# Patient Record
Sex: Female | Born: 2000 | Hispanic: Yes | Marital: Single | Smoking: Never smoker
Health system: Southern US, Community
[De-identification: ages and names within clinical notes are randomized; demographics above are authoritative.]

## PROBLEM LIST (undated history)

## (undated) DIAGNOSIS — D509 Iron deficiency anemia, unspecified: Secondary | ICD-10-CM

---
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
## 2020-01-17 ENCOUNTER — Emergency Department: Payer: Medicaid Other

## 2020-01-17 ENCOUNTER — Other Ambulatory Visit: Payer: Self-pay

## 2020-01-17 ENCOUNTER — Emergency Department
Admission: EM | Admit: 2020-01-17 | Discharge: 2020-01-17 | Disposition: A | Discharge status: Home / Self Care | Payer: Medicaid Other | Attending: Student | Admitting: Student

## 2020-01-17 DIAGNOSIS — B001 Herpesviral vesicular dermatitis: Secondary | ICD-10-CM

## 2020-01-17 DIAGNOSIS — R102 Pelvic and perineal pain: Secondary | ICD-10-CM | POA: Diagnosis not present

## 2020-01-17 DIAGNOSIS — Z202 Contact with and (suspected) exposure to infections with a predominantly sexual mode of transmission: Secondary | ICD-10-CM | POA: Diagnosis not present

## 2020-01-17 DIAGNOSIS — A6004 Herpesviral vulvovaginitis: Secondary | ICD-10-CM | POA: Diagnosis not present

## 2020-01-17 DIAGNOSIS — R350 Frequency of micturition: Secondary | ICD-10-CM | POA: Diagnosis present

## 2020-01-17 LAB — CBC
HCT: 35.2 % — ABNORMAL LOW (ref 36.0–46.0)
Hemoglobin: 11 g/dL — ABNORMAL LOW (ref 12.0–15.0)
MCH: 26.4 pg (ref 26.0–34.0)
MCHC: 31.3 g/dL (ref 30.0–36.0)
MCV: 84.6 fL (ref 80.0–100.0)
Platelets: 209 10*3/uL (ref 150–400)
RBC: 4.16 MIL/uL (ref 3.87–5.11)
RDW: 14.2 % (ref 11.5–15.5)
WBC: 11.9 10*3/uL — ABNORMAL HIGH (ref 4.0–10.5)
nRBC: 0 % (ref 0.0–0.2)

## 2020-01-17 LAB — WET PREP, GENITAL
Clue Cells Wet Prep HPF POC: NONE SEEN
Sperm: NONE SEEN
Trich, Wet Prep: NONE SEEN
Yeast Wet Prep HPF POC: NONE SEEN

## 2020-01-17 LAB — URINALYSIS, COMPLETE (UACMP) WITH MICROSCOPIC
Bilirubin Urine: NEGATIVE
Glucose, UA: NEGATIVE mg/dL
Ketones, ur: 80 mg/dL — AB
Nitrite: NEGATIVE
Protein, ur: 30 mg/dL — AB
Specific Gravity, Urine: 1.028 (ref 1.005–1.030)
pH: 5 (ref 5.0–8.0)

## 2020-01-17 LAB — BASIC METABOLIC PANEL
Anion gap: 9 (ref 5–15)
BUN: 8 mg/dL (ref 6–20)
CO2: 22 mmol/L (ref 22–32)
Calcium: 8.8 mg/dL — ABNORMAL LOW (ref 8.9–10.3)
Chloride: 103 mmol/L (ref 98–111)
Creatinine, Ser: 0.48 mg/dL (ref 0.44–1.00)
GFR calc Af Amer: >60 mL/min (ref >60)
GFR calc non Af Amer: >60 mL/min (ref >60)
Glucose, Bld: 124 mg/dL — ABNORMAL HIGH (ref 70–99)
Potassium: 3.6 mmol/L (ref 3.5–5.1)
Sodium: 134 mmol/L — ABNORMAL LOW (ref 135–145)

## 2020-01-17 LAB — CHLAMYDIA/NGC RT PCR (ARMC ONLY)
Chlamydia Tr: NOT DETECTED
N gonorrhoeae: NOT DETECTED

## 2020-01-17 LAB — POCT PREGNANCY, URINE: Preg Test, Ur: NEGATIVE

## 2020-01-17 MED ORDER — CEFTRIAXONE SODIUM 250 MG IJ SOLR
250.0000 mg | Freq: Once | INTRAMUSCULAR | Status: AC
  Administered 2020-01-17: 250 mg via INTRAMUSCULAR
  Filled 2020-01-17: qty 250

## 2020-01-17 MED ORDER — DEXTROSE 5 % IV SOLN
5.0000 mg/kg | Freq: Once | INTRAVENOUS | Status: AC
  Administered 2020-01-17: 320 mg via INTRAVENOUS
  Filled 2020-01-17: qty 6.4

## 2020-01-17 MED ORDER — ACYCLOVIR 400 MG PO TABS: 400.0000 mg | ORAL_TABLET | Freq: Two times a day (BID) | ORAL | 6 refills | Status: AC

## 2020-01-17 MED ORDER — SODIUM CHLORIDE 0.9 % IV BOLUS
1000.0000 mL | Freq: Once | INTRAVENOUS | Status: AC
  Administered 2020-01-17: 1000 mL via INTRAVENOUS

## 2020-01-17 MED ORDER — AZITHROMYCIN 500 MG PO TABS
1000.0000 mg | ORAL_TABLET | Freq: Once | ORAL | Status: AC
  Administered 2020-01-17: 1000 mg via ORAL
  Filled 2020-01-17: qty 2

## 2020-01-17 MED ORDER — TRAMADOL HCL 50 MG PO TABS: 50.0000 mg | ORAL_TABLET | Freq: Four times a day (QID) | ORAL | 0 refills | Status: AC | PRN

## 2020-01-17 MED ORDER — LIDOCAINE HCL (PF) 1 % IJ SOLN
INTRAMUSCULAR | Status: AC
  Administered 2020-01-17: 0.9 mL
  Filled 2020-01-17: qty 5

## 2020-01-17 NOTE — ED Notes (Signed)
Patient given call light and instructed to call when bladder feels full.

## 2020-01-17 NOTE — ED Provider Notes (Signed)
[facility] Emergency Department Provider Note  ____________________________________________   First MD Initiated Contact with Patient 01/17/20 1209     (approximate)  I have reviewed the triage vital signs and the nursing notes.   HISTORY  Chief Complaint Urinary Frequency    HPI Rachel King is a 19 y.o. female presents emergency department complaining of painful urination for the past 3 days with thick white vaginal discharge.  She states she also has some bumps "down there ".  No nausea or vomiting.  Some abdominal pain.  Patient states that she was tested for Covid and was negative.  Denies any cough or congestion.  No chest pain or shortness of breath    Past Medical History:  Diagnosis Date  . Iron deficiency anemia     There are no problems to display for this patient.   History reviewed. No pertinent surgical history.  Prior to Admission medications   Medication Sig Start Date End Date Taking? Authorizing Provider  acyclovir (ZOVIRAX) 400 MG tablet Take 1 tablet (400 mg total) by mouth 2 (two) times daily. 01/17/20 02/16/20  Adryan Shin, Roselyn Bering, PA-C  traMADol (ULTRAM) 50 MG tablet Take 1 tablet (50 mg total) by mouth every 6 (six) hours as needed. 01/17/20   Faythe Ghee, PA-C    Allergies Penicillins and Shellfish allergy  No family history on file.  Social History Social History   Tobacco Use  . Smoking status: Never Smoker  . Smokeless tobacco: Never Used  Substance Use Topics  . Alcohol use: Not on file  . Drug use: Not on file    Review of Systems  Constitutional:  fever/chills Eyes: No visual changes. ENT: No sore throat. Respiratory: Denies cough Cardiovascular: Denies chest pain Gastrointestinal: Positive abdominal pain Genitourinary: Positive for dysuria. Musculoskeletal: Negative for back pain. Skin: Negative for rash. Psychiatric: no mood changes,      ____________________________________________   PHYSICAL EXAM:  VITAL SIGNS: ED Triage Vitals  Enc Vitals Group     BP 01/17/20 0936 127/70     Pulse Rate 01/17/20 0936 (!) 115     Resp 01/17/20 0936 16     Temp 01/17/20 0936 100.3 F (37.9 C)     Temp Source 01/17/20 0936 Oral     SpO2 --      Weight 01/17/20 0937 140 lb (63.5 kg)     Height 01/17/20 0937 5\' 3"  (1.6 m)     Head Circumference --      Peak Flow --      Pain Score 01/17/20 0937 5     Pain Loc --      Pain Edu? --      Excl. in GC? --     Constitutional: Alert and oriented. Well appearing and in no acute distress. Eyes: Conjunctivae are normal.  Head: Atraumatic. Nose: No congestion/rhinnorhea. Mouth/Throat: Mucous membranes are moist.   Neck:  supple no lymphadenopathy noted Cardiovascular: Normal rate, regular rhythm. Heart sounds are normal Respiratory: Normal respiratory effort.  No retractions, lungs c t a  Abd: soft tender in the left lower quadrant bs normal all 4 quad GU: External vaginal exam shows herpetic-like lesions noted, area is very tender to palpation, patient is not able to tolerate speculum exam bimanual exam does not show any cervical motion tenderness or concerns for PID Musculoskeletal: FROM all extremities, warm and well perfused Neurologic:  Normal speech and language.  Skin:  Skin is warm, dry  Psychiatric: Mood and affect  are normal. Speech and behavior are normal.  ____________________________________________   LABS (all labs ordered are listed, but only abnormal results are displayed)  Labs Reviewed  WET PREP, GENITAL - Abnormal; Notable for the following components:      Result Value   WBC, Wet Prep HPF POC FEW (*)    All other components within normal limits  URINALYSIS, COMPLETE (UACMP) WITH MICROSCOPIC - Abnormal; Notable for the following components:   Color, Urine YELLOW (*)    APPearance HAZY (*)    Hgb urine dipstick SMALL (*)    Ketones, ur 80 (*)     Protein, ur 30 (*)    Leukocytes,Ua MODERATE (*)    Bacteria, UA RARE (*)    All other components within normal limits  BASIC METABOLIC PANEL - Abnormal; Notable for the following components:   Sodium 134 (*)    Glucose, Bld 124 (*)    Calcium 8.8 (*)    All other components within normal limits  CBC - Abnormal; Notable for the following components:   WBC 11.9 (*)    Hemoglobin 11.0 (*)    HCT 35.2 (*)    All other components within normal limits  CHLAMYDIA/NGC RT PCR ([facility] ONLY)  URINE CULTURE  HSV 2 ANTIBODY, IGG  POC URINE PREG, ED  POCT PREGNANCY, URINE   ____________________________________________   ____________________________________________  RADIOLOGY  Ultrasound the pelvis does not show a torsion and is otherwise normal  ____________________________________________   PROCEDURES  Procedure(s) performed: Acyclovir IV , Rocephin 250 mg IM, Zithromax 1 g p.o.  Procedures    ____________________________________________   INITIAL IMPRESSION / ASSESSMENT AND PLAN / ED COURSE  Pertinent labs & imaging results that were available during my care of the patient were reviewed by me and considered in my medical decision making (see chart for details).   Patient is 19 year old female presents emergency department with abdominal pain, painful urination, and vaginal discharge for 3 days.  Positive for fever.  Was tested for Covid this week and was negative.  See HPI  Physical exam patient appears stable.  She does have low-grade temp of 100.3 with elevated pulse at 115.  Remainder the vitals are normal.  Physical exam does show a small amount of tenderness in the left lower quadrant.  DDx: PID, UTI, ovarian cyst, herpes, abscess  CBC is elevated WBC of 11.9, basic metabolic panel is generally normal with sodium at 134, glucose of 124, calcium 8.8, POC pregnancy is negative, urinalysis does show 80 ketones, moderate leuks, 21-50 WBCs and rare  bacteria.  Pelvic exam did show herpetic lesions in the exterior with a thickened white and somewhat discolored discharge.  Due to the herpetic type lesions I did do a HSV IgG test. Patient was given acyclovir IV, Zithromax 1 g p.o., Rocephin 250 mg IM.  She was given prescriptions for acyclovir and tramadol.  She is to follow-up with [facility] department if her HSV testing shows is positive as this would indicate testing for HIV and syphilis.  She states she understands will comply.  She was discharged in stable condition.      Rachel King was evaluated in Emergency Department on 01/17/2020 for the symptoms described in the history of present illness. She was evaluated in the context of the global COVID-19 pandemic, which necessitated consideration that the patient might be at risk for infection with the SARS-CoV-2 virus that causes COVID-19. Institutional protocols and algorithms that pertain to the evaluation of patients at  risk for COVID-19 are in a state of rapid change based on information released by regulatory bodies including the CDC and federal and state organizations. These policies and algorithms were followed during the patient's care in the ED.   As part of my medical decision making, I reviewed the following data within the [place] notes reviewed and incorporated, Labs reviewed , Old chart reviewed, Radiograph reviewed , Notes from prior ED visits and [place] Controlled Substance Database  ____________________________________________   FINAL CLINICAL IMPRESSION(S) / ED DIAGNOSES  Final diagnoses:  Pelvic pain  Herpes labialis without complication  STD exposure      NEW MEDICATIONS STARTED DURING THIS VISIT:  New Prescriptions   ACYCLOVIR (ZOVIRAX) 400 MG TABLET    Take 1 tablet (400 mg total) by mouth 2 (two) times daily.   TRAMADOL (ULTRAM) 50 MG TABLET    Take 1 tablet (50 mg total) by mouth every 6 (six) hours as needed.      Note:  This document was prepared using Dragon voice recognition software and may include unintentional dictation errors.    Versie Starks, PA-C 01/17/20 1555    Lilia Pro., MD 01/17/20 [phone]

## 2020-01-17 NOTE — ED Triage Notes (Signed)
Pt c/o painful urination for the past 3 days with thick white vaginal discharge. States she had a fever this morning and took tylenol at 4am.

## 2020-01-17 NOTE — Discharge Instructions (Signed)
Follow-up with the health department if your herpes test is positive. Return to the emergency department if worsening Take your medication as prescribed

## 2020-01-18 LAB — URINE CULTURE: Culture: NO GROWTH

## 2020-01-18 LAB — HSV 2 ANTIBODY, IGG: HSV 2 Glycoprotein G Ab, IgG: <0.91 index (ref 0.00–0.90)

## 2020-02-12 ENCOUNTER — Encounter: Payer: Self-pay | Admitting: Certified Nurse Midwife

## 2020-02-12 ENCOUNTER — Ambulatory Visit (INDEPENDENT_AMBULATORY_CARE_PROVIDER_SITE_OTHER): Payer: Self-pay | Admitting: Certified Nurse Midwife

## 2020-02-12 ENCOUNTER — Other Ambulatory Visit: Payer: Self-pay

## 2020-02-12 ENCOUNTER — Other Ambulatory Visit (HOSPITAL_COMMUNITY)
Admission: RE | Admit: 2020-02-12 | Discharge: 2020-02-12 | Disposition: A | Discharge status: Home / Self Care | Payer: Medicaid Other | Source: Ambulatory Visit | Attending: Certified Nurse Midwife | Admitting: Certified Nurse Midwife

## 2020-02-12 VITALS — BP 108/86 | HR 92 | Ht 63.0 in | Wt 138.3 lb

## 2020-02-12 DIAGNOSIS — Z113 Encounter for screening for infections with a predominantly sexual mode of transmission: Secondary | ICD-10-CM | POA: Diagnosis present

## 2020-02-12 DIAGNOSIS — Z23 Encounter for immunization: Secondary | ICD-10-CM

## 2020-02-12 MED ORDER — TETANUS-DIPHTH-ACELL PERTUSSIS 5-2.5-18.5 LF-MCG/0.5 IM SUSP: 0.5000 mL | Freq: Once | INTRAMUSCULAR | Status: DC

## 2020-02-12 MED ORDER — TETANUS-DIPHTH-ACELL PERTUSSIS 5-2.5-18.5 LF-MCG/0.5 IM SUSP
0.5000 mL | Freq: Once | INTRAMUSCULAR | Status: AC
  Administered 2020-02-12: 0.5 mL via INTRAMUSCULAR

## 2020-02-12 NOTE — Patient Instructions (Signed)
Vaginitis Vaginitis is a condition in which the vaginal tissue swells and becomes red (inflamed). This condition is most often caused by a change in the normal balance of bacteria and yeast that live in the vagina. This change causes an overgrowth of certain bacteria or yeast, which causes the inflammation. There are different types of vaginitis, but the most common types are:  Bacterial vaginosis.  Yeast infection (candidiasis).  Trichomoniasis vaginitis. This is a sexually transmitted disease (STD).  Viral vaginitis.  Atrophic vaginitis.  Allergic vaginitis. What are the causes? The cause of this condition depends on the type of vaginitis. It can be caused by:  Bacteria (bacterial vaginosis).  Yeast, which is a fungus (yeast infection).  A parasite (trichomoniasis vaginitis).  A virus (viral vaginitis).  Low hormone levels (atrophic vaginitis). Low hormone levels can occur during pregnancy, breastfeeding, or after menopause.  Irritants, such as bubble baths, scented tampons, and feminine sprays (allergic vaginitis). Other factors can change the normal balance of the yeast and bacteria that live in the vagina. These include:  Antibiotic medicines.  Poor hygiene.  Diaphragms, vaginal sponges, spermicides, birth control pills, and intrauterine devices (IUD).  Sex.  Infection.  Uncontrolled diabetes.  A weakened defense (immune) system. What increases the risk? This condition is more likely to develop in women who:  Smoke.  Use vaginal douches, scented tampons, or scented sanitary pads.  Wear tight-fitting pants.  Wear thong underwear.  Use oral birth control pills or an IUD.  Have sex without a condom.  Have multiple sex partners.  Have an STD.  Frequently use the spermicide nonoxynol-9.  Eat lots of foods high in sugar.  Have uncontrolled diabetes.  Have low estrogen levels.  Have a weakened immune system from an immune disorder or medical  treatment.  Are pregnant or breastfeeding. What are the signs or symptoms? Symptoms vary depending on the cause of the vaginitis. Common symptoms include:  Abnormal vaginal discharge. ? The discharge is white, gray, or yellow with bacterial vaginosis. ? The discharge is thick, white, and cheesy with a yeast infection. ? The discharge is frothy and yellow or greenish with trichomoniasis.  A bad vaginal smell. The smell is fishy with bacterial vaginosis.  Vaginal itching, pain, or swelling.  Sex that is painful.  Pain or burning when urinating. Sometimes there are no symptoms. How is this diagnosed? This condition is diagnosed based on your symptoms and medical history. A physical exam, including a pelvic exam, will also be done. You may also have other tests, including:  Tests to determine the pH level (acidity or alkalinity) of your vagina.  A whiff test, to assess the odor that results when a sample of your vaginal discharge is mixed with a potassium hydroxide solution.  Tests of vaginal fluid. A sample will be examined under a microscope. How is this treated? Treatment varies depending on the type of vaginitis you have. Your treatment may include:  Antibiotic creams or pills to treat bacterial vaginosis and trichomoniasis.  Antifungal medicines, such as vaginal creams or suppositories, to treat a yeast infection.  Medicine to ease discomfort if you have viral vaginitis. Your sexual partner should also be treated.  Estrogen delivered in a cream, pill, suppository, or vaginal ring to treat atrophic vaginitis. If vaginal dryness occurs, lubricants and moisturizing creams may help. You may need to avoid scented soaps, sprays, or douches.  Stopping use of a product that is causing allergic vaginitis. Then using a vaginal cream to treat the symptoms. Follow   these instructions at home: Lifestyle  Keep your genital area clean and dry. Avoid soap, and only rinse the area with  water.  Do not douche or use tampons until your health care provider says it is okay to do so. Use sanitary pads, if needed.  Do not have sex until your health care provider approves. When you can return to sex, practice safe sex and use condoms.  Wipe from front to back. This avoids the spread of bacteria from the rectum to the vagina. General instructions  Take over-the-counter and prescription medicines only as told by your health care provider.  If you were prescribed an antibiotic medicine, take or use it as told by your health care provider. Do not stop taking or using the antibiotic even if you start to feel better.  Keep all follow-up visits as told by your health care provider. This is important. How is this prevented?  Use mild, non-scented products. Do not use things that can irritate the vagina, such as fabric softeners. Avoid the following products if they are scented: ? Feminine sprays. ? Detergents. ? Tampons. ? Feminine hygiene products. ? Soaps or bubble baths.  Let air reach your genital area. ? Wear cotton underwear to reduce moisture buildup. ? Avoid wearing underwear while you sleep. ? Avoid wearing tight pants and underwear or nylons without a cotton panel. ? Avoid wearing thong underwear.  Take off any wet clothing, such as bathing suits, as soon as possible.  Practice safe sex and use condoms. Contact a health care provider if:  You have abdominal pain.  You have a fever.  You have symptoms that last for more than 2-3 days. Get help right away if:  You have a fever and your symptoms suddenly get worse. Summary  Vaginitis is a condition in which the vaginal tissue becomes inflamed.This condition is most often caused by a change in the normal balance of bacteria and yeast that live in the vagina.  Treatment varies depending on the type of vaginitis you have.  Do not douche, use tampons , or have sex until your health care provider approves. When  you can return to sex, practice safe sex and use condoms. This information is not intended to replace advice given to you by your health care provider. Make sure you discuss any questions you have with your health care provider. Document Revised: 09/22/2017 Document Reviewed: 11/15/2016 Elsevier Patient Education  2020 [employer]  

## 2020-02-12 NOTE — Progress Notes (Signed)
GYN ENCOUNTER NOTE  Subjective:       Rachel King is a 19 y.o. No obstetric history on file. female is here for gynecologic evaluation of the following issues:  1. Pt states she was referred to [place] after being in the hospital for pelvic infection. She is unsure what the infections was. She states she had some pimples on her bottom that were painful with wiping. They gave her acyclovir. They told her that her test for chlamydia and herpes was negative. She is here today for follow up testing. She is sexually active with one female partner. She has had 7 total partners in her life.    Gynecologic History Patient's last menstrual period was 01/29/2020 (exact date). Contraception: condoms, Some times Last Pap: n/a  Last mammogram: n/a   Obstetric History OB History  Gravida Para Term Preterm AB Living  0 0 0 0 0 0  SAB TAB Ectopic Multiple Live Births  0 0 0 0 0    History reviewed. No pertinent past medical history.  History reviewed. No pertinent surgical history.  Current Outpatient Medications on File Prior to Visit  Medication Sig Dispense Refill  . acyclovir (ZOVIRAX) 400 MG tablet Take 400 mg by mouth 2 (two) times daily.    . ferrous sulfate 325 (65 FE) MG EC tablet Take 325 mg by mouth 3 (three) times daily with meals.     No current facility-administered medications on file prior to visit.    Allergies  Allergen Reactions  . Penicillins     Social History   Socioeconomic History  . Marital status: Single    Spouse name: Not on file  . Number of children: Not on file  . Years of education: Not on file  . Highest education level: Not on file  Occupational History  . Not on file  Tobacco Use  . Smoking status: Never Smoker  . Smokeless tobacco: Never Used  Substance and Sexual Activity  . Alcohol use: Never  . Drug use: Never  . Sexual activity: Yes    Birth control/protection: None  Other Topics Concern  . Not on file  Social History Narrative  . Not on  file   Social Determinants of Health   Financial Resource Strain:   . Difficulty of Paying Living Expenses:   Food Insecurity:   . Worried About Programme researcher, broadcasting/film/video in the Last Year:   . Barista in the Last Year:   Transportation Needs:   . Freight forwarder (Medical):   Marland Kitchen Lack of Transportation (Non-Medical):   Physical Activity:   . Days of Exercise per Week:   . Minutes of Exercise per Session:   Stress:   . Feeling of Stress :   Social Connections:   . Frequency of Communication with Friends and Family:   . Frequency of Social Gatherings with Friends and Family:   . Attends Religious Services:   . Active Member of Clubs or Organizations:   . Attends Banker Meetings:   Marland Kitchen Marital Status:   Intimate Partner Violence:   . Fear of Current or Ex-Partner:   . Emotionally Abused:   Marland Kitchen Physically Abused:   . Sexually Abused:     History reviewed. No pertinent family history.  The following portions of the patient's history were reviewed and updated as appropriate: allergies, current medications, past family history, past medical history, past social history, past surgical history and problem list.  Review of Systems Review of  Systems - Negative except HPI Review of Systems - General ROS: negative for - chills, fatigue, fever, hot flashes, malaise or night sweats Hematological and Lymphatic ROS: negative for - bleeding problems or swollen lymph nodes Gastrointestinal ROS: negative for - abdominal pain, blood in stools, change in bowel habits and nausea/vomiting Musculoskeletal ROS: negative for - joint pain, muscle pain or muscular weakness Genito-Urinary ROS: negative for - change in menstrual cycle, dysmenorrhea, dyspareunia, dysuria, genital discharge, genital ulcers, hematuria, incontinence, irregular/heavy menses, nocturia or pelvic pain Objective:   BP 108/86   Pulse 92   Ht 5\' 3"  (1.6 m)   Wt 138 lb 5 oz (62.7 kg)   LMP 01/29/2020 (Exact  Date)   BMI 24.50 kg/m  CONSTITUTIONAL: Well-developed, well-nourished female in no acute distress.  HENT:  Normocephalic, atraumatic.  NECK: Normal range of motion, supple, no masses.  Normal thyroid.  SKIN: Skin is warm and dry. No rash noted. Not diaphoretic. No erythema. No pallor. [place]: Alert and oriented to person, place, and time. PSYCHIATRIC: Normal mood and affect. Normal behavior. Normal judgment and thought content. CARDIOVASCULAR:Not Examined RESPIRATORY: Not Examined BREASTS: Not Examined ABDOMEN: Soft, non distended; Non tender.  No Organomegaly. PELVIC:  External Genitalia: Normal, no active lesions seen  BUS: Normal  Vagina: Normal  Cervix: Normal  Bladder: Nontender MUSCULOSKELETAL: Normal range of motion. No tenderness.  No cyanosis, clubbing, or edema.   Assessment:   Screening for STD   Plan:   Discussed STDs and encouraged condom use all the time. Specifically talked about HSV 1 HSV2 , outbreaks and treatment. Labs collected today. Answered all of her questions.Pt declines other methods of birth control.  Follow up for annual exam or PRN.   Philip Aspen, CNM

## 2020-02-13 LAB — HEPATITIS B SURFACE ANTIGEN: Hepatitis B Surface Ag: NEGATIVE

## 2020-02-13 LAB — HSV 1 AND 2 IGM ABS, INDIRECT
HSV 1 IgM: <1:10 {titer}
HSV 2 IgM: <1:10 {titer}

## 2020-02-13 LAB — HIV ANTIBODY (ROUTINE TESTING W REFLEX): HIV Screen 4th Generation wRfx: NONREACTIVE

## 2020-02-13 LAB — SYPHILIS: RPR W/REFLEX TO RPR TITER AND TREPONEMAL ANTIBODIES, TRADITIONAL SCREENING AND DIAGNOSIS ALGORITHM: RPR Ser Ql: NONREACTIVE

## 2020-02-14 LAB — CERVICOVAGINAL ANCILLARY ONLY
Bacterial Vaginitis (gardnerella): POSITIVE — AB
Candida Glabrata: NEGATIVE
Candida Vaginitis: POSITIVE — AB
Chlamydia: NEGATIVE
Comment: NEGATIVE
Comment: NEGATIVE
Comment: NEGATIVE
Comment: NEGATIVE
Comment: NEGATIVE
Comment: NORMAL
Neisseria Gonorrhea: NEGATIVE
Trichomonas: NEGATIVE

## 2020-02-17 ENCOUNTER — Telehealth: Payer: Self-pay | Admitting: Certified Nurse Midwife

## 2020-02-17 ENCOUNTER — Telehealth: Payer: Self-pay

## 2020-02-17 ENCOUNTER — Other Ambulatory Visit: Payer: Self-pay | Admitting: Certified Nurse Midwife

## 2020-02-17 MED ORDER — METRONIDAZOLE 500 MG PO TABS: 500.0000 mg | ORAL_TABLET | Freq: Two times a day (BID) | ORAL | 0 refills | Status: AC

## 2020-02-17 MED ORDER — FLUCONAZOLE 150 MG PO TABS: 150.0000 mg | ORAL_TABLET | Freq: Once | ORAL | 0 refills | Status: AC

## 2020-02-17 NOTE — Progress Notes (Signed)
Pt vaginal swab positive for yeast and BV, orders placed for treatment. Pt notified via my chart.   Doreene Burke, CNM

## 2020-02-17 NOTE — Telephone Encounter (Signed)
Returned patients call- all questions answered regarding test results.

## 2020-02-17 NOTE — Telephone Encounter (Signed)
Patient called requesting someone contact her regarding her lab results. Please advise.

## 2020-03-16 ENCOUNTER — Encounter: Payer: Self-pay | Admitting: Certified Nurse Midwife

## 2020-03-16 ENCOUNTER — Other Ambulatory Visit: Payer: Self-pay

## 2020-03-16 ENCOUNTER — Ambulatory Visit (INDEPENDENT_AMBULATORY_CARE_PROVIDER_SITE_OTHER): Payer: Medicaid Other | Admitting: Certified Nurse Midwife

## 2020-03-16 VITALS — BP 120/65 | HR 99 | Ht 63.0 in | Wt 142.2 lb

## 2020-03-16 DIAGNOSIS — N926 Irregular menstruation, unspecified: Secondary | ICD-10-CM | POA: Diagnosis not present

## 2020-03-16 LAB — POCT URINE PREGNANCY: Preg Test, Ur: POSITIVE — AB

## 2020-03-16 NOTE — Progress Notes (Signed)
Subjective:    Rachel King is a 19 y.o. female who presents for evaluation of amenorrhea. She believes she could be pregnant. Pregnancy is desired. Sexual Activity: single partner, contraception: none. Current symptoms also include: nausea. Last period was normal.   Patient's last menstrual period was 01/29/2020 (exact date). The following portions of the patient's history were reviewed and updated as appropriate: allergies, current medications, past family history, past medical history, past social history, past surgical history and problem list.  Review of Systems Pertinent items are noted in HPI.     Objective:    BP 120/65   Pulse 99   Ht 5\' 3"  (1.6 m)   Wt 142 lb 4 oz (64.5 kg)   LMP 01/29/2020 (Exact Date)   BMI 25.20 kg/m  General: alert, cooperative, appears stated age and no acute distress    Lab Review Urine HCG: positive    Assessment:    Absence of menstruation.     Plan:   Positive: EDC: 11/04/20. Briefly discussed pre-natal care options. Md or Midwifery care discussed.  Encouraged well-balanced diet, plenty of rest when needed, pre-natal vitamins daily and walking for exercise. Discussed self-help for nausea, avoiding OTC medications until consulting provider or pharmacist, other than Tylenol as needed, minimal caffeine (1-2 cups daily) and avoiding alcohol. She will schedule an u/s 2 wks for dating, nurse visit in 4 wks and  her initial NOB visit in 6 wks.. Feel free to call with any questions  01/02/21, CNM

## 2020-03-31 ENCOUNTER — Ambulatory Visit (INDEPENDENT_AMBULATORY_CARE_PROVIDER_SITE_OTHER): Payer: Medicaid Other

## 2020-03-31 ENCOUNTER — Other Ambulatory Visit: Payer: Self-pay

## 2020-03-31 DIAGNOSIS — N926 Irregular menstruation, unspecified: Secondary | ICD-10-CM | POA: Diagnosis not present

## 2020-03-31 DIAGNOSIS — Z3A09 9 weeks gestation of pregnancy: Secondary | ICD-10-CM | POA: Diagnosis not present

## 2020-04-10 ENCOUNTER — Other Ambulatory Visit: Payer: Self-pay

## 2020-04-10 ENCOUNTER — Ambulatory Visit (INDEPENDENT_AMBULATORY_CARE_PROVIDER_SITE_OTHER): Payer: Medicaid Other | Admitting: Surgical

## 2020-04-10 VITALS — BP 122/74 | HR 93 | Ht 63.0 in | Wt 145.8 lb

## 2020-04-10 DIAGNOSIS — Z3491 Encounter for supervision of normal pregnancy, unspecified, first trimester: Secondary | ICD-10-CM

## 2020-04-10 NOTE — Progress Notes (Signed)
Rachel King presents for [employer] nurse interview visit. Pregnancy confirmation done 03/16/2020. G1. P. Pregnancy education material explained and given. 0 cats in home. NOB labs ordered. HIV labs and drug screen were explained and ordered. PNV encouraged. Genetic screening options discussed. Genetic testing: Ordered/Declined/Unsure. Patient may discuss with the provider. Patient to follow up with provider on 04/28/2020 weeks for NOB physical. All questions answered.   Patient discussed that she may transfer care to [facility]. She is wanting to deliver at [facility].

## 2020-04-11 LAB — MICROSCOPIC EXAMINATION
Casts: NONE SEEN /lpf
RBC, Urine: NONE SEEN /hpf (ref 0–2)

## 2020-04-11 LAB — ABO AND RH: Rh Factor: POSITIVE

## 2020-04-11 LAB — ANTIBODY SCREEN: Antibody Screen: NEGATIVE

## 2020-04-11 LAB — URINALYSIS, ROUTINE W REFLEX MICROSCOPIC
Bilirubin, UA: NEGATIVE
Ketones, UA: NEGATIVE
Nitrite, UA: NEGATIVE
Protein,UA: NEGATIVE
RBC, UA: NEGATIVE
Specific Gravity, UA: 1.018 (ref 1.005–1.030)
Urobilinogen, Ur: 0.2 mg/dL (ref 0.2–1.0)
pH, UA: 7 (ref 5.0–7.5)

## 2020-04-11 LAB — RUBELLA SCREEN: Rubella Antibodies, IGG: 3.84 index (ref >0.99)

## 2020-04-11 LAB — RPR: RPR Ser Ql: NONREACTIVE

## 2020-04-11 LAB — HEPATITIS B SURFACE ANTIGEN: Hepatitis B Surface Ag: NEGATIVE

## 2020-04-11 LAB — VARICELLA ZOSTER ANTIBODY, IGG: Varicella zoster IgG: 1260 index (ref >165)

## 2020-04-11 LAB — HIV ANTIBODY (ROUTINE TESTING W REFLEX): HIV Screen 4th Generation wRfx: NONREACTIVE

## 2020-04-12 LAB — GC/CHLAMYDIA PROBE AMP
Chlamydia trachomatis, NAA: NEGATIVE
Neisseria Gonorrhoeae by PCR: NEGATIVE

## 2020-04-13 LAB — MONITOR DRUG PROFILE 14(MW)
Amphetamine Scrn, Ur: NEGATIVE ng/mL
BARBITURATE SCREEN URINE: NEGATIVE ng/mL
BENZODIAZEPINE SCREEN, URINE: NEGATIVE ng/mL
Buprenorphine, Urine: NEGATIVE ng/mL
CANNABINOIDS UR QL SCN: NEGATIVE ng/mL
Cocaine (Metab) Scrn, Ur: NEGATIVE ng/mL
Creatinine(Crt), U: 51.5 mg/dL (ref 20.0–300.0)
Fentanyl, Urine: NEGATIVE pg/mL
Meperidine Screen, Urine: NEGATIVE ng/mL
Methadone Screen, Urine: NEGATIVE ng/mL
OXYCODONE+OXYMORPHONE UR QL SCN: NEGATIVE ng/mL
Opiate Scrn, Ur: NEGATIVE ng/mL
Ph of Urine: 6.7 (ref 4.5–8.9)
Phencyclidine Qn, Ur: NEGATIVE ng/mL
Propoxyphene Scrn, Ur: NEGATIVE ng/mL
SPECIFIC GRAVITY: 1.009
Tramadol Screen, Urine: NEGATIVE ng/mL

## 2020-04-13 LAB — NICOTINE SCREEN, URINE: Cotinine Ql Scrn, Ur: NEGATIVE ng/mL

## 2020-04-13 LAB — URINE CULTURE, OB REFLEX

## 2020-04-13 LAB — CULTURE, OB URINE

## 2020-04-14 ENCOUNTER — Other Ambulatory Visit: Payer: Self-pay | Admitting: Certified Nurse Midwife

## 2020-04-14 MED ORDER — NITROFURANTOIN MONOHYD MACRO 100 MG PO CAPS
100.0000 mg | ORAL_CAPSULE | Freq: Two times a day (BID) | ORAL | 0 refills | Status: AC
Start: 2020-04-14 — End: 2020-04-21

## 2020-04-28 ENCOUNTER — Encounter: Payer: Medicaid Other | Admitting: Certified Nurse Midwife

## 2021-02-15 ENCOUNTER — Encounter: Payer: Self-pay | Admitting: Certified Nurse Midwife

## 2021-03-01 ENCOUNTER — Encounter: Payer: Self-pay | Admitting: Certified Nurse Midwife

## 2021-09-24 IMAGING — US US PELVIS COMPLETE
1 series · 14 of 25 positions shown · non-contrast
Comparison: None.

CLINICAL DATA: Left lower quadrant abdomen pain

EXAM:
TRANSABDOMINAL ULTRASOUND OF PELVIS
DOPPLER ULTRASOUND OF OVARIES
TECHNIQUE: Transabdominal ultrasound examination of the pelvis was performed
including evaluation of the uterus, ovaries, adnexal regions, and
pelvic cul-de-sac.
Color and duplex Doppler ultrasound was utilized to evaluate blood
flow to the ovaries.

[Series 1: us pelvis (transabdominal only) · 14 of 54 slices shown]
[im 1/54]
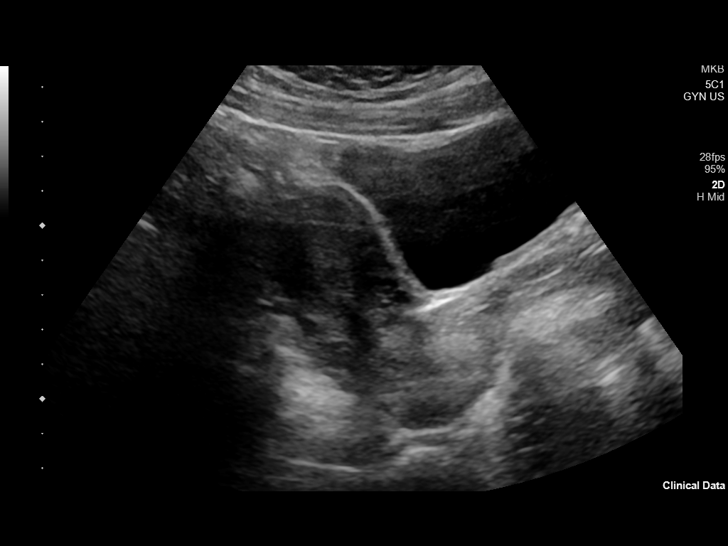
[im 5/54]
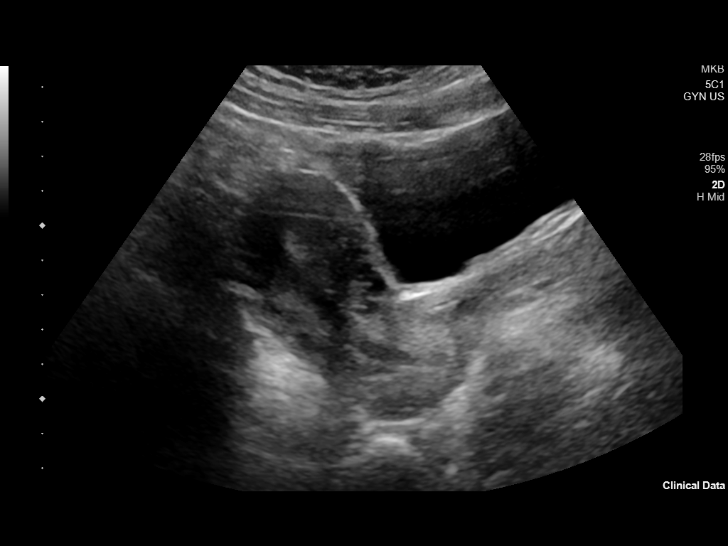
[im 9/54]
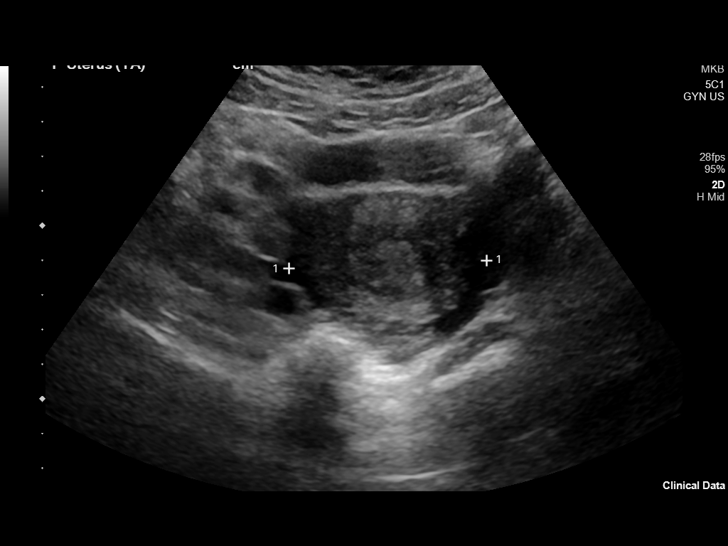
[im 14/54]
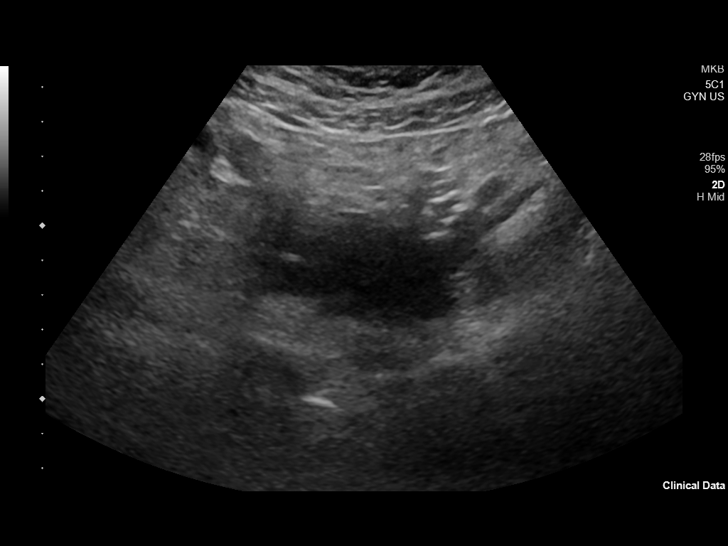
[im 18/54]
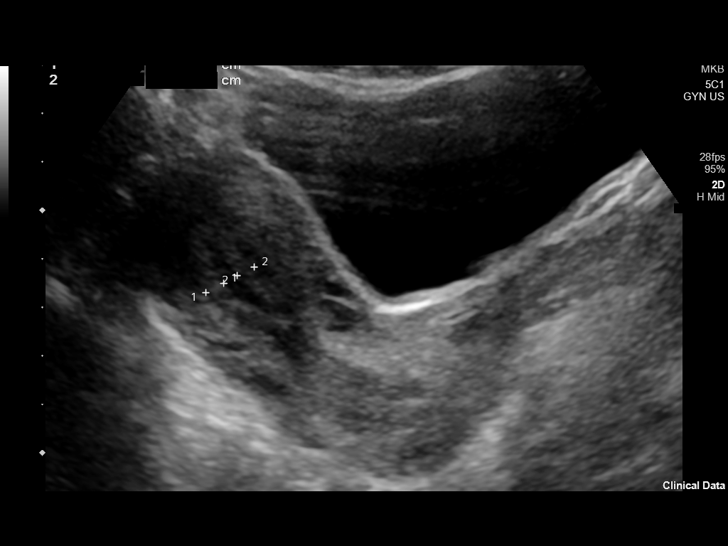
[im 20/54]
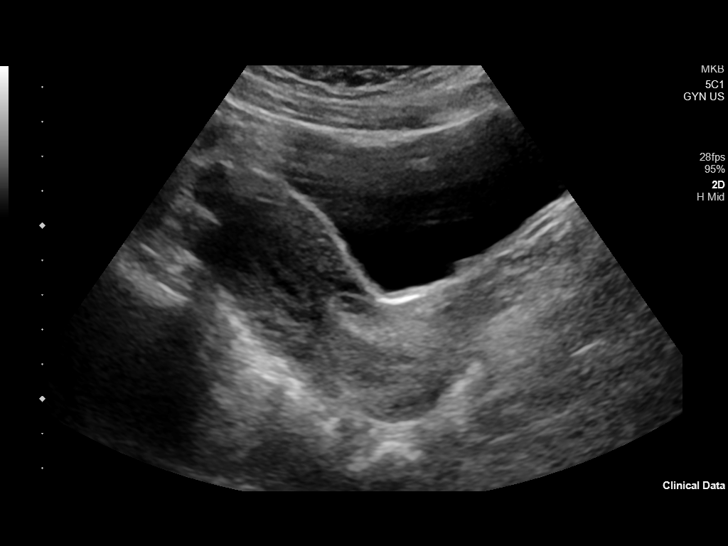
[im 25/54]
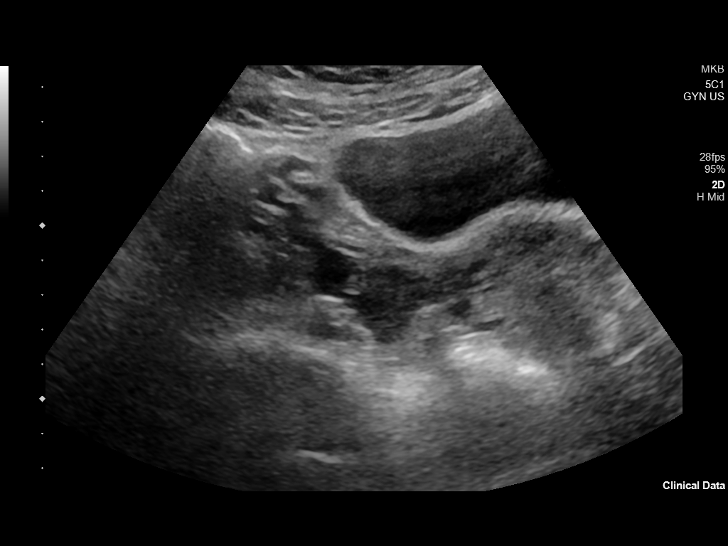
[im 29/54]
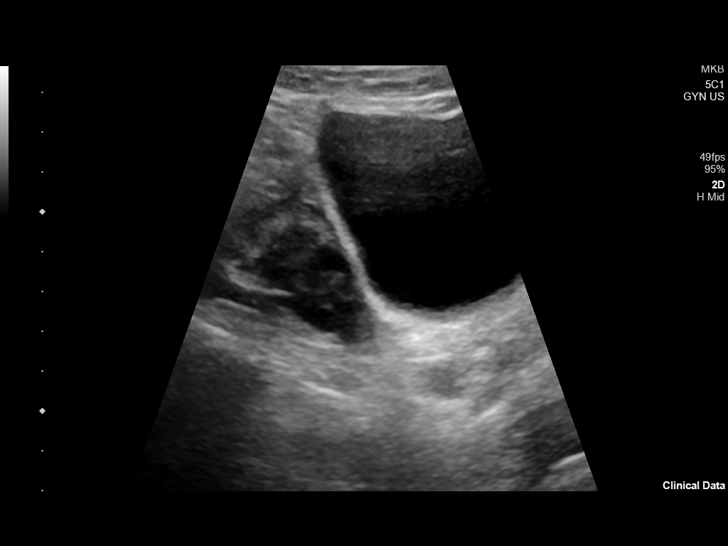
[im 34/54]
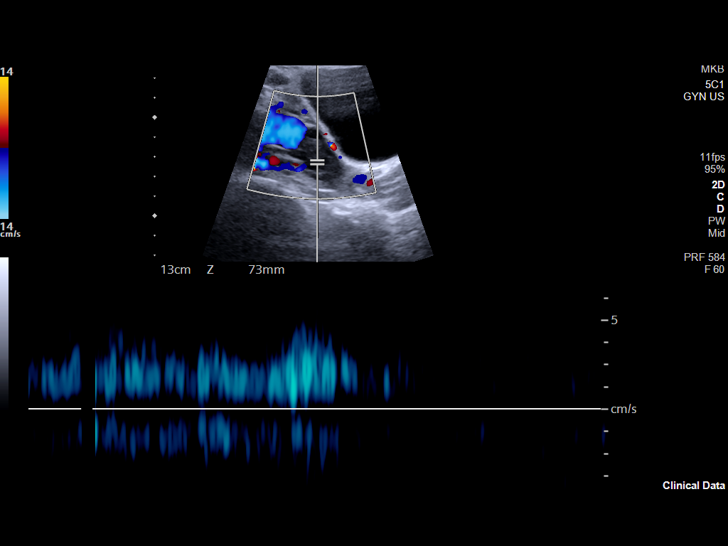
[im 36/54]
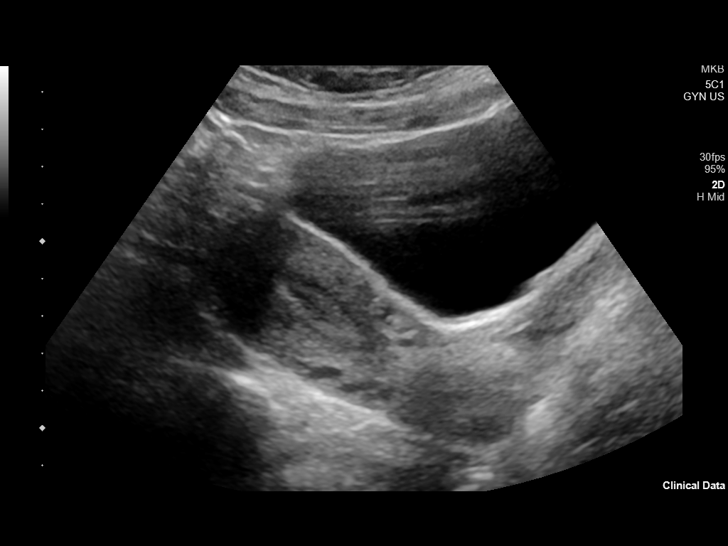
[im 40/54]
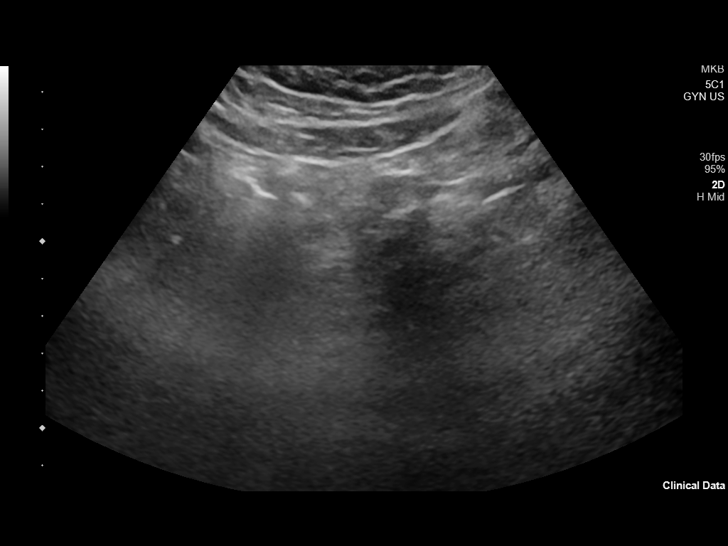
[im 45/54]
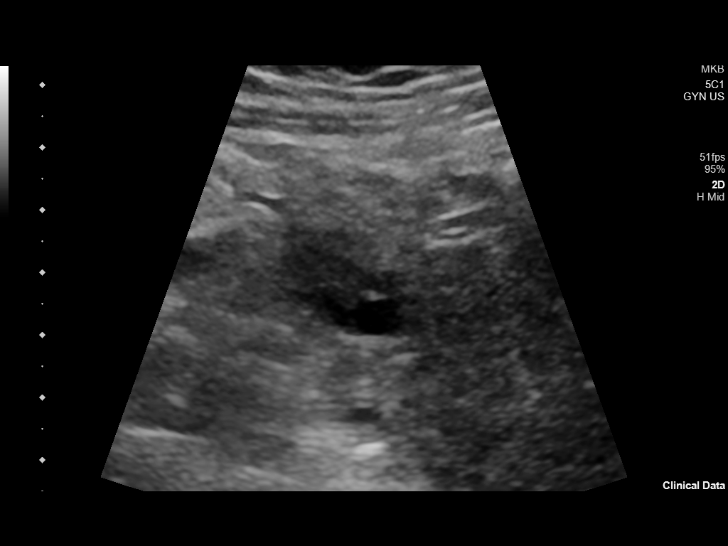
[im 49/54]
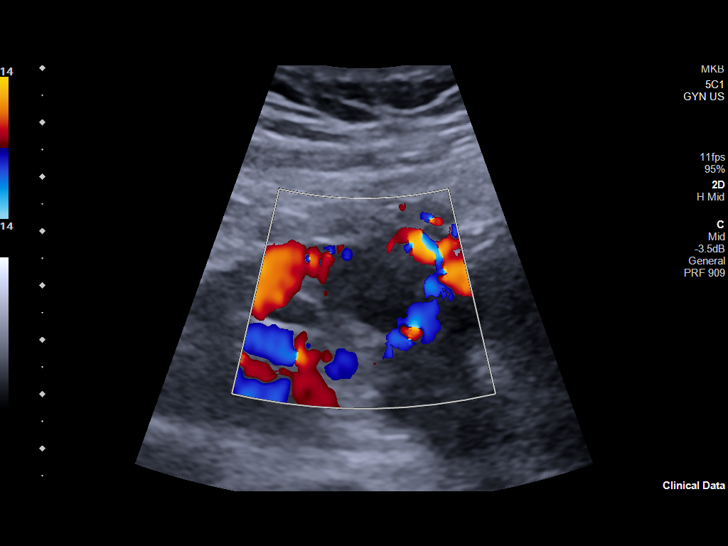
[im 54/54]
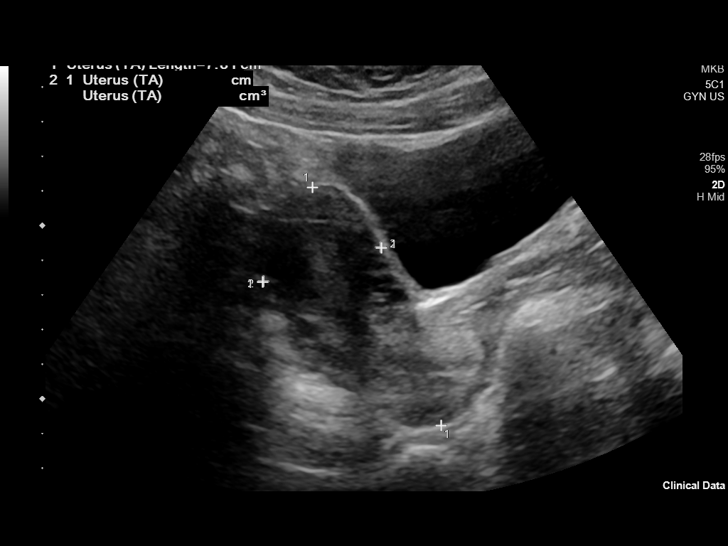

[14 of 25 positions shown; findings below may reference images not displayed]

FINDINGS: Uterus

Measurements: 7.8 x 3.6 x 5.7 cm = volume: 83.4 mL. No fibroids or
other mass visualized.

Endometrium

Thickness: 8 mm. No focal abnormality visualized. Small amount of
fluid is identified in the endometrial cavity.

Right ovary

Measurements: 3.6 x 1.9 x 3.5 cm = volume: 12.5 mL. Normal
appearance/no adnexal mass.

Left ovary

Measurements: 2.2 x 1.7 x 1.6 cm = volume: 3.1 mL. Normal
appearance/no adnexal mass.

Pulsed Doppler evaluation demonstrates normal low-resistance
arterial and venous waveforms in both ovaries.

Other: None.
IMPRESSION: Normal pelvic ultrasound.

Normal pelvic ultrasound.

## 2021-09-28 ENCOUNTER — Encounter: Payer: Medicaid Other | Admitting: Certified Nurse Midwife

## 2024-07-12 ENCOUNTER — Other Ambulatory Visit: Payer: Self-pay | Admitting: Family Medicine

## 2024-07-12 DIAGNOSIS — Z3201 Encounter for pregnancy test, result positive: Secondary | ICD-10-CM

## 2024-07-18 ENCOUNTER — Ambulatory Visit: Attending: Family Medicine
